# Patient Record
Sex: Female | Born: 1995 | Race: Black or African American | Hispanic: No | Marital: Single | Smoking: Never smoker
Health system: Southern US, Community
[De-identification: ages and names within clinical notes are randomized; demographics above are authoritative.]

## PROBLEM LIST (undated history)

## (undated) DIAGNOSIS — G43909 Migraine, unspecified, not intractable, without status migrainosus: Secondary | ICD-10-CM

## (undated) HISTORY — PX: TONSILLECTOMY: SUR1361

## (undated) HISTORY — PX: TONSILLECTOMY AND ADENOIDECTOMY: SUR1326

---

## 2016-05-18 ENCOUNTER — Encounter (HOSPITAL_COMMUNITY): Payer: Self-pay | Admitting: Emergency Medicine

## 2016-05-18 ENCOUNTER — Ambulatory Visit (HOSPITAL_COMMUNITY)
Admission: EM | Admit: 2016-05-18 | Discharge: 2016-05-18 | Disposition: A | Discharge status: Home / Self Care | Payer: Self-pay | Attending: Family Medicine | Admitting: Family Medicine

## 2016-05-18 DIAGNOSIS — T148 Other injury of unspecified body region: Secondary | ICD-10-CM

## 2016-05-18 DIAGNOSIS — M79671 Pain in right foot: Secondary | ICD-10-CM

## 2016-05-18 DIAGNOSIS — W57XXXA Bitten or stung by nonvenomous insect and other nonvenomous arthropods, initial encounter: Secondary | ICD-10-CM

## 2016-05-18 MED ORDER — PREDNISONE 50 MG PO TABS: ORAL_TABLET | ORAL | 0 refills | Status: DC

## 2016-05-18 MED ORDER — TRIAMCINOLONE ACETONIDE 0.1 % EX CREA: 1.0000 "application " | TOPICAL_CREAM | Freq: Two times a day (BID) | CUTANEOUS | 0 refills | Status: DC

## 2016-05-18 NOTE — ED Triage Notes (Signed)
Patient is [place] back [place] [facility].  Yesterday noticed pain, swelling, itching, burning to right foot.  Patient has not seen a bug or felt a sting.  No known injury

## 2016-05-18 NOTE — ED Provider Notes (Signed)
CSN: [id number]     Arrival date & time 05/18/16  1202 History   First MD Initiated Contact with Patient 05/18/16 1222     Chief Complaint  Patient presents with  . Foot Pain   (Consider location/radiation/quality/duration/timing/severity/associated sxs/prior Treatment) 20 year old female states that yesterday she felt a bite or sting to the right foot. She noticed there was initially itching around the dorsum of the foot followed by a burning and stinging Pain. Over the next few hours there was swelling over the dorsum of the foot. She is complaining of tenderness, itching and burning to the dorsum of the foot. Swelling extends to but not including the ankle. The patient emphatically denies trauma, injury, repetitive use or other activity that would have produced some sort of injury. Denies systemic symptoms. No stomach swelling, itching or rash.      History reviewed. No pertinent past medical history. Past Surgical History:  Procedure Laterality Date  . TONSILLECTOMY     No family history on file. Social History  Substance Use Topics  . Smoking status: Never Smoker  . Smokeless tobacco: Never Used  . Alcohol use No   OB History    No data available     Review of Systems  Constitutional: Negative.  Negative for chills and fever.  HENT: Negative.   Eyes: Negative.   Respiratory: Negative.  Negative for cough and shortness of breath.   Cardiovascular: Negative for chest pain and leg swelling.  Gastrointestinal: Negative.   Skin: Negative for color change, rash and wound.  Neurological: Negative.   All other systems reviewed and are negative.   Allergies  Review of patient's allergies indicates no known allergies.  Home Medications   Prior to Admission medications   Medication Sig Start Date End Date Taking? Authorizing Provider  predniSONE (DELTASONE) 50 MG tablet 1 tab po on day 1, one tab po on day 2, 1/2 tab po on days 3 and 4 05/18/16   Hayden Rasmussen, NP   triamcinolone cream (KENALOG) 0.1 % Apply 1 application topically 2 (two) times daily. 05/18/16   Hayden Rasmussen, NP   Meds Ordered and Administered this Visit  Medications - No data to display  BP 121/57 (BP Location: Left Arm)   Pulse 80   Temp 99 F (37.2 C) (Oral)   Resp 12   SpO2 98%  No data found.   Physical Exam  Constitutional: She is oriented to person, place, and time. She appears well-developed and well-nourished. No distress.  HENT:  Head: Normocephalic and atraumatic.  Eyes: EOM are normal.  Neck: Normal range of motion. Neck supple.  Cardiovascular: Normal rate.   Pulmonary/Chest: Effort normal. No respiratory distress.  Musculoskeletal: She exhibits edema and tenderness.  Right foot with diffuse swelling over the dorsum of the foot. There is no erythema or other discoloration. No deformity. There is tenderness and sensitivity to the overlying skin with a very light touch. No signs of cellulitis or lymphangitis. No wound. No apparent bite mark. Demonstrates full range of motion of the ankle and inability to wiggle her toes.  Neurological: She is alert and oriented to person, place, and time. She exhibits normal muscle tone.  Skin: Skin is warm and dry.  Psychiatric: She has a normal mood and affect.  Nursing note and vitals reviewed.   Urgent Care Course   Clinical Course    Procedures (including critical care time)  Labs Review Labs Reviewed - No data to display  Imaging Review No results found.  Visual Acuity Review  Right Eye Distance:   Left Eye Distance:   Bilateral Distance:    Right Eye Near:   Left Eye Near:    Bilateral Near:         MDM   1. Insect bite   2. Foot pain, right    Insect Bite Read these instructions. Apply cold compresses or ice over the foot 4-5 times a day for the next 2 days. Apply the triamcinolone cream over the top of the foot twice a day. Recommend taking an antihistamine such as Claritin or Allegra on a  daily basis for the next few days to help with itching and swelling. For worsening, new symptoms or problems, red streaks, swelling in your face or time or throat, cough or trouble breathing or other problems seek medical attention promptly. Meds ordered this encounter  Medications  . predniSONE (DELTASONE) 50 MG tablet    Sig: 1 tab po on day 1, one tab po on day 2, 1/2 tab po on days 3 and 4    Dispense:  3 tablet    Refill:  0    Order Specific Question:   Supervising Provider    Answer:   Tyrone NineGRUNZ, RYAN B [id number]]  . triamcinolone cream (KENALOG) 0.1 %    Sig: Apply 1 application topically 2 (two) times daily.    Dispense:  15 g    Refill:  0    Order Specific Question:   Supervising Provider    Answer:   Candie MileGRUNZ, RYAN B [[id number]]       Hayden Rasmussenavid Levoy Geisen, NP 05/18/16 1256

## 2019-01-13 DIAGNOSIS — J4520 Mild intermittent asthma, uncomplicated: Secondary | ICD-10-CM | POA: Insufficient documentation

## 2019-06-05 ENCOUNTER — Ambulatory Visit (HOSPITAL_COMMUNITY)
Admission: EM | Admit: 2019-06-05 | Discharge: 2019-06-05 | Disposition: A | Discharge status: Home / Self Care | Payer: BC Managed Care – PPO | Attending: Emergency Medicine | Admitting: Emergency Medicine

## 2019-06-05 ENCOUNTER — Encounter (HOSPITAL_COMMUNITY): Payer: Self-pay | Admitting: Emergency Medicine

## 2019-06-05 ENCOUNTER — Other Ambulatory Visit: Payer: Self-pay

## 2019-06-05 DIAGNOSIS — R51 Headache: Secondary | ICD-10-CM | POA: Diagnosis not present

## 2019-06-05 DIAGNOSIS — R519 Headache, unspecified: Secondary | ICD-10-CM

## 2019-06-05 MED ORDER — ONDANSETRON 4 MG PO TBDP
4.0000 mg | ORAL_TABLET | Freq: Once | ORAL | Status: AC
  Administered 2019-06-05: 4 mg via ORAL

## 2019-06-05 MED ORDER — ONDANSETRON HCL 4 MG PO TABS: 4.0000 mg | ORAL_TABLET | Freq: Four times a day (QID) | ORAL | 0 refills | Status: DC

## 2019-06-05 MED ORDER — KETOROLAC TROMETHAMINE 30 MG/ML IJ SOLN
30.0000 mg | Freq: Once | INTRAMUSCULAR | Status: AC
  Administered 2019-06-05: 30 mg via INTRAMUSCULAR

## 2019-06-05 MED ORDER — ONDANSETRON 4 MG PO TBDP
ORAL_TABLET | ORAL | Status: AC
  Filled 2019-06-05: qty 1

## 2019-06-05 MED ORDER — IBUPROFEN 600 MG PO TABS: 600.0000 mg | ORAL_TABLET | Freq: Four times a day (QID) | ORAL | 0 refills | Status: DC | PRN

## 2019-06-05 MED ORDER — KETOROLAC TROMETHAMINE 30 MG/ML IJ SOLN
INTRAMUSCULAR | Status: AC
  Filled 2019-06-05: qty 1

## 2019-06-05 NOTE — ED Provider Notes (Signed)
[facility]    CSN: [id number] Arrival date & time: 06/05/19  1705      History   Chief Complaint Chief Complaint  Patient presents with  . Headache    HPI Beverly Perkins is a 23 y.o. female.   Patient presents with generalized headache x1 week; and nausea which started today. No emesis.  She has taken over-the-counter pain relievers such as BC powders which relieve her headache but then it "returns when the medication wears off".  No known falls or injury.  Patient states she has had a similar headache in the past when she first started her depo injections but her depo injection is not due again for one month.  She denies  dizziness, numbness, weakness, vision changes, fever, congestion, ear pain, sore throat, or other symptoms.    The history is provided by the patient.    History reviewed. No pertinent past medical history.  There are no active problems to display for this patient.   Past Surgical History:  Procedure Laterality Date  . TONSILLECTOMY      OB History   No obstetric history on file.      Home Medications    Prior to Admission medications   Medication Sig Start Date End Date Taking? Authorizing Provider  ibuprofen (ADVIL) 600 MG tablet Take 1 tablet (600 mg total) by mouth every 6 (six) hours as needed. 06/06/19   Mickie Bailate, Julisa Flippo H, NP  ondansetron (ZOFRAN) 4 MG tablet Take 1 tablet (4 mg total) by mouth every 6 (six) hours. 06/06/19   Mickie Bailate, Winda Summerall H, NP  predniSONE (DELTASONE) 50 MG tablet 1 tab po on day 1, one tab po on day 2, 1/2 tab po on days 3 and 4 Patient not taking: Reported on 06/05/2019 05/18/16   Hayden RasmussenMabe, David, NP  triamcinolone cream (KENALOG) 0.1 % Apply 1 application topically 2 (two) times daily. 05/18/16   Hayden RasmussenMabe, David, NP    Family History Family History  Problem Relation Age of Onset  . Hypertension Mother     Social History Social History   Tobacco Use  . Smoking status: Never Smoker  . Smokeless tobacco: Never Used   Substance Use Topics  . Alcohol use: No  . Drug use: No     Allergies   Patient has no known allergies.   Review of Systems Review of Systems  Constitutional: Negative for chills and fever.  HENT: Negative for congestion, ear pain, rhinorrhea and sore throat.   Eyes: Negative for pain and visual disturbance.  Respiratory: Negative for cough and shortness of breath.   Cardiovascular: Negative for chest pain and palpitations.  Gastrointestinal: Positive for nausea. Negative for abdominal pain, diarrhea and vomiting.  Genitourinary: Negative for dysuria and hematuria.  Musculoskeletal: Negative for arthralgias and back pain.  Skin: Negative for color change and rash.  Neurological: Positive for headaches. Negative for seizures and syncope.  All other systems reviewed and are negative.    Physical Exam Triage Vital Signs ED Triage Vitals [06/05/19 1726]  Enc Vitals Group     BP 133/60     Pulse Rate 90     Resp 18     Temp 99.3 F (37.4 C)     Temp Source Oral     SpO2 97 %     Weight      Height      Head Circumference      Peak Flow      Pain Score 8  Pain Loc      Pain Edu?      Excl. in [place]?    No data found.  Updated Vital Signs BP 133/60 (BP Location: Right Arm)   Pulse 90   Temp 99.3 F (37.4 C) (Oral)   Resp 18   SpO2 97%   Visual Acuity Right Eye Distance:   Left Eye Distance:   Bilateral Distance:    Right Eye Near:   Left Eye Near:    Bilateral Near:     Physical Exam Vitals signs and nursing note reviewed.  Constitutional:      General: She is not in acute distress.    Appearance: She is well-developed.  HENT:     Head: Normocephalic and atraumatic.     Right Ear: Tympanic membrane normal.     Left Ear: Tympanic membrane normal.     Nose: Nose normal.     Mouth/Throat:     Mouth: Mucous membranes are moist.     Pharynx: Oropharynx is clear.  Eyes:     Extraocular Movements: Extraocular movements intact.      Conjunctiva/sclera: Conjunctivae normal.     Pupils: Pupils are equal, round, and reactive to light.  Neck:     Musculoskeletal: Neck supple.  Cardiovascular:     Rate and Rhythm: Normal rate and regular rhythm.     Heart sounds: No murmur.  Pulmonary:     Effort: Pulmonary effort is normal. No respiratory distress.     Breath sounds: Normal breath sounds.  Abdominal:     Palpations: Abdomen is soft.     Tenderness: There is no abdominal tenderness.  Skin:    General: Skin is warm and dry.  Neurological:     General: No focal deficit present.     Mental Status: She is alert and oriented to person, place, and time.     Cranial Nerves: No cranial nerve deficit.     Sensory: No sensory deficit.     Motor: No weakness.     Coordination: Coordination normal.     Gait: Gait normal.     Deep Tendon Reflexes: Reflexes normal.      UC Treatments / Results  Labs (all labs ordered are listed, but only abnormal results are displayed) Labs Reviewed - No data to display  EKG   Radiology No results found.  Procedures Procedures (including critical care time)  Medications Ordered in UC Medications  ketorolac (TORADOL) 30 MG/ML injection 30 mg (30 mg Intramuscular Given 06/05/19 1747)  ondansetron (ZOFRAN-ODT) disintegrating tablet 4 mg (4 mg Oral Given 06/05/19 1748)  ketorolac (TORADOL) 30 MG/ML injection (has no administration in time range)  ondansetron (ZOFRAN-ODT) 4 MG disintegrating tablet (has no administration in time range)    Initial Impression / Assessment and Plan / UC Course  I have reviewed the triage vital signs and the nursing notes.  Pertinent labs & imaging results that were available during my care of the patient were reviewed by me and considered in my medical decision making (see chart for details).   Headache and nausea.  Treating with Toradol and Zofran here.  Prescription for ibuprofen and Zofran PRN to start tomorrow.  Instructed patient to call her PCP  to be seen if her headache persists.  Instructed her to go to the emergency department if she develops acute worsening pain, changes in her vision, dizziness, numbness, weakness, or other concerning symptoms.  Patient agrees with plan of care.     Final Clinical Impressions(s) /  UC Diagnoses   Final diagnoses:  Acute nonintractable headache, unspecified headache type     Discharge Instructions     You were given an injection of a pain medication called Toradol today.  You were given a medication for your nausea called Zofran.    Additionally, you can take the prescribed ibuprofen and Zofran as directed starting tomorrow.    Follow-up with your primary care provider or the provider listed below if your headache continues.  Go to the emergency department if you develop acute worsening pain, changes in your vision, dizziness, numbness, weakness, or other concerning symptoms.        ED Prescriptions    Medication Sig Dispense Auth. Provider   ibuprofen (ADVIL) 600 MG tablet Take 1 tablet (600 mg total) by mouth every 6 (six) hours as needed. 30 tablet Mickie Bail, NP   ondansetron (ZOFRAN) 4 MG tablet Take 1 tablet (4 mg total) by mouth every 6 (six) hours. 12 tablet Mickie Bail, NP     Controlled Substance Prescriptions [place] Controlled Substance Registry consulted? Not Applicable   Mickie Bail, NP 06/05/19 1751

## 2019-06-05 NOTE — ED Triage Notes (Signed)
Pt sts HA x 5 days with some nausea today

## 2019-06-05 NOTE — Discharge Instructions (Signed)
You were given an injection of a pain medication called Toradol today.  You were given a medication for your nausea called Zofran.    Additionally, you can take the prescribed ibuprofen and Zofran as directed starting tomorrow.    Follow-up with your primary care provider or the provider listed below if your headache continues.  Go to the emergency department if you develop acute worsening pain, changes in your vision, dizziness, numbness, weakness, or other concerning symptoms.

## 2019-07-06 DIAGNOSIS — R519 Headache, unspecified: Secondary | ICD-10-CM | POA: Insufficient documentation

## 2020-01-11 ENCOUNTER — Ambulatory Visit: Payer: BC Managed Care – PPO | Attending: Internal Medicine

## 2020-01-11 DIAGNOSIS — Z20822 Contact with and (suspected) exposure to covid-19: Secondary | ICD-10-CM

## 2020-01-12 LAB — SARS-COV-2, NAA 2 DAY TAT

## 2020-01-12 LAB — NOVEL CORONAVIRUS, NAA: SARS-CoV-2, NAA: NOT DETECTED

## 2020-05-28 DIAGNOSIS — F419 Anxiety disorder, unspecified: Secondary | ICD-10-CM | POA: Insufficient documentation

## 2021-01-03 ENCOUNTER — Encounter (HOSPITAL_COMMUNITY): Payer: Self-pay | Admitting: Emergency Medicine

## 2021-01-03 ENCOUNTER — Encounter: Payer: Self-pay | Admitting: Nurse Practitioner

## 2021-01-03 ENCOUNTER — Emergency Department (HOSPITAL_COMMUNITY)
Admission: EM | Admit: 2021-01-03 | Discharge: 2021-01-03 | Disposition: A | Discharge status: Home / Self Care | Payer: 59 | Attending: Emergency Medicine | Admitting: Emergency Medicine

## 2021-01-03 ENCOUNTER — Emergency Department (HOSPITAL_COMMUNITY): Payer: 59

## 2021-01-03 DIAGNOSIS — R197 Diarrhea, unspecified: Secondary | ICD-10-CM | POA: Insufficient documentation

## 2021-01-03 DIAGNOSIS — F17200 Nicotine dependence, unspecified, uncomplicated: Secondary | ICD-10-CM | POA: Diagnosis not present

## 2021-01-03 DIAGNOSIS — G8929 Other chronic pain: Secondary | ICD-10-CM | POA: Insufficient documentation

## 2021-01-03 DIAGNOSIS — R1011 Right upper quadrant pain: Secondary | ICD-10-CM | POA: Insufficient documentation

## 2021-01-03 DIAGNOSIS — R112 Nausea with vomiting, unspecified: Secondary | ICD-10-CM | POA: Insufficient documentation

## 2021-01-03 HISTORY — DX: Migraine, unspecified, not intractable, without status migrainosus: G43.909

## 2021-01-03 LAB — CBC
HCT: 44.9 % (ref 36.0–46.0)
Hemoglobin: 14.3 g/dL (ref 12.0–15.0)
MCH: 27.7 pg (ref 26.0–34.0)
MCHC: 31.8 g/dL (ref 30.0–36.0)
MCV: 86.8 fL (ref 80.0–100.0)
Platelets: 485 10*3/uL — ABNORMAL HIGH (ref 150–400)
RBC: 5.17 MIL/uL — ABNORMAL HIGH (ref 3.87–5.11)
RDW: 13.3 % (ref 11.5–15.5)
WBC: 24.3 10*3/uL — ABNORMAL HIGH (ref 4.0–10.5)
nRBC: 0 % (ref 0.0–0.2)

## 2021-01-03 LAB — COMPREHENSIVE METABOLIC PANEL
ALT: 19 U/L (ref 0–44)
AST: 17 U/L (ref 15–41)
Albumin: 4.1 g/dL (ref 3.5–5.0)
Alkaline Phosphatase: 75 U/L (ref 38–126)
Anion gap: 12 (ref 5–15)
BUN: 11 mg/dL (ref 6–20)
CO2: 18 mmol/L — ABNORMAL LOW (ref 22–32)
Calcium: 9.3 mg/dL (ref 8.9–10.3)
Chloride: 107 mmol/L (ref 98–111)
Creatinine, Ser: 0.66 mg/dL (ref 0.44–1.00)
GFR, Estimated: >60 mL/min (ref >60)
Glucose, Bld: 135 mg/dL — ABNORMAL HIGH (ref 70–99)
Potassium: 3.6 mmol/L (ref 3.5–5.1)
Sodium: 137 mmol/L (ref 135–145)
Total Bilirubin: 0.6 mg/dL (ref 0.3–1.2)
Total Protein: 8.4 g/dL — ABNORMAL HIGH (ref 6.5–8.1)

## 2021-01-03 LAB — URINALYSIS, ROUTINE W REFLEX MICROSCOPIC
Bilirubin Urine: NEGATIVE
Glucose, UA: NEGATIVE mg/dL
Hgb urine dipstick: NEGATIVE
Ketones, ur: 20 mg/dL — AB
Leukocytes,Ua: NEGATIVE
Nitrite: NEGATIVE
Protein, ur: NEGATIVE mg/dL
Specific Gravity, Urine: >1.046 — ABNORMAL HIGH (ref 1.005–1.030)
pH: 5 (ref 5.0–8.0)

## 2021-01-03 LAB — LIPASE, BLOOD: Lipase: 32 U/L (ref 11–51)

## 2021-01-03 LAB — I-STAT BETA HCG BLOOD, ED (MC, WL, AP ONLY): I-stat hCG, quantitative: <5 m[IU]/mL (ref <5)

## 2021-01-03 MED ORDER — SODIUM CHLORIDE 0.9 % IV BOLUS
500.0000 mL | Freq: Once | INTRAVENOUS | Status: AC
  Administered 2021-01-03: 500 mL via INTRAVENOUS

## 2021-01-03 MED ORDER — ONDANSETRON HCL 4 MG/2ML IJ SOLN
4.0000 mg | Freq: Once | INTRAMUSCULAR | Status: AC
  Administered 2021-01-03: 4 mg via INTRAVENOUS
  Filled 2021-01-03: qty 2

## 2021-01-03 MED ORDER — IOHEXOL 300 MG/ML  SOLN
100.0000 mL | Freq: Once | INTRAMUSCULAR | Status: AC | PRN
  Administered 2021-01-03: 100 mL via INTRAVENOUS

## 2021-01-03 MED ORDER — KETOROLAC TROMETHAMINE 30 MG/ML IJ SOLN
30.0000 mg | Freq: Once | INTRAMUSCULAR | Status: AC
  Administered 2021-01-03: 30 mg via INTRAVENOUS
  Filled 2021-01-03: qty 1

## 2021-01-03 NOTE — ED Triage Notes (Signed)
Pt reports upper abdominal pain ongoing for the last several months, with vomiting and diarrhea. Current episode began last Thursday. Denies recent fevers. VSS.

## 2021-01-03 NOTE — Discharge Instructions (Signed)
You have been seen and discharged from the emergency department.  Establish care and follow-up with gastroenterology for reevaluation and further care.  You may require scoping for further diagnosis.  Take home medications as prescribed. If you have any worsening symptoms or further concerns for health please return to an emergency department for further evaluation.

## 2021-01-03 NOTE — ED Provider Notes (Signed)
[facility] EMERGENCY DEPARTMENT Provider Note   CSN: [id number] Arrival date & time: 01/03/21  7829     History Chief Complaint  Patient presents with  . Abdominal Pain    Beverly Perkins is a 25 y.o. female.  HPI   25 year old female with past medical history of migraines presents to the emergency department with concern for acute on chronic abdominal pain.  Patient states of the past couple months she has been having upper abdominal pain that is sometimes associated with nausea/vomiting/diarrhea.  About a week ago the pain became more severe, persistent.  For the last week she has been having nausea/vomiting/diarrhea, nonbloody.  But now she feels as if the abdominal pain is at its worse.  No fever.  No genitourinary symptoms.  No previous abdominal surgeries.  States that she gets regular periods and does not believe that she could be pregnant.  Past Medical History:  Diagnosis Date  . Migraines     There are no problems to display for this patient.   Past Surgical History:  Procedure Laterality Date  . TONSILLECTOMY       OB History   No obstetric history on file.     Family History  Problem Relation Age of Onset  . Hypertension Mother     Social History   Tobacco Use  . Smoking status: Current Every Day Smoker  . Smokeless tobacco: Never Used  Substance Use Topics  . Alcohol use: Yes    Comment: occasional   . Drug use: Yes    Types: Marijuana    Home Medications Prior to Admission medications   Medication Sig Start Date End Date Taking? Authorizing Provider  ibuprofen (ADVIL) 600 MG tablet Take 1 tablet (600 mg total) by mouth every 6 (six) hours as needed. 06/06/19   Mickie Bail, NP  ondansetron (ZOFRAN) 4 MG tablet Take 1 tablet (4 mg total) by mouth every 6 (six) hours. 06/06/19   Mickie Bail, NP  predniSONE (DELTASONE) 50 MG tablet 1 tab po on day 1, one tab po on day 2, 1/2 tab po on days 3 and 4 Patient not taking:  Reported on 06/05/2019 05/18/16   Hayden Rasmussen, NP  triamcinolone cream (KENALOG) 0.1 % Apply 1 application topically 2 (two) times daily. 05/18/16   Hayden Rasmussen, NP    Allergies    Patient has no known allergies.  Review of Systems   Review of Systems  Constitutional: Negative for chills and fever.  HENT: Negative for congestion.   Eyes: Negative for visual disturbance.  Respiratory: Negative for shortness of breath.   Cardiovascular: Negative for chest pain.  Gastrointestinal: Positive for abdominal pain, diarrhea, nausea and vomiting. Negative for blood in stool.  Genitourinary: Negative for dysuria, pelvic pain, vaginal bleeding and vaginal discharge.  Skin: Negative for rash.  Neurological: Negative for headaches.    Physical Exam Updated Vital Signs BP 100/77   Pulse 83   Temp (!) 97.5 F (36.4 C) (Oral)   Resp (!) 22   Ht 5\' 2"  (1.575 m)   SpO2 99%   Physical Exam Vitals and nursing note reviewed.  Constitutional:      Appearance: Normal appearance.  HENT:     Head: Normocephalic.     Mouth/Throat:     Mouth: Mucous membranes are moist.  Cardiovascular:     Rate and Rhythm: Normal rate.  Pulmonary:     Effort: Pulmonary effort is normal. No respiratory distress.  Abdominal:  General: There is no distension.     Palpations: Abdomen is soft.     Tenderness: There is generalized abdominal tenderness and tenderness in the right upper quadrant and epigastric area. Negative signs include Murphy's sign.     Hernia: No hernia is present.  Skin:    General: Skin is warm.  Neurological:     Mental Status: She is alert and oriented to person, place, and time. Mental status is at baseline.  Psychiatric:        Mood and Affect: Mood normal.     ED Results / Procedures / Treatments   Labs (all labs ordered are listed, but only abnormal results are displayed) Labs Reviewed  COMPREHENSIVE METABOLIC PANEL - Abnormal; Notable for the following components:      Result  Value   CO2 18 (*)    Glucose, Bld 135 (*)    Total Protein 8.4 (*)    All other components within normal limits  CBC - Abnormal; Notable for the following components:   WBC 24.3 (*)    RBC 5.17 (*)    Platelets 485 (*)    All other components within normal limits  LIPASE, BLOOD  URINALYSIS, ROUTINE W REFLEX MICROSCOPIC  I-STAT BETA HCG BLOOD, ED ([facility], [facility], [facility] ONLY)    EKG None  Radiology No results found.  Procedures Procedures   Medications Ordered in ED Medications  sodium chloride 0.9 % bolus 500 mL (500 mLs Intravenous New Bag/Given 01/03/21 1056)  ketorolac (TORADOL) 30 MG/ML injection 30 mg (30 mg Intravenous Given 01/03/21 1056)  ondansetron (ZOFRAN) injection 4 mg (4 mg Intravenous Given 01/03/21 1055)    ED Course  I have reviewed the triage vital signs and the nursing notes.  Pertinent labs & imaging results that were available during my care of the patient were reviewed by me and considered in my medical decision making (see chart for details).    MDM Rules/Calculators/A&P                          25 year old female presents the emergency department generalized abdominal pain, nausea/vomiting/diarrhea.  She has diffuse tenderness on palpation, no guarding.  Blood work shows a leukocytosis of 24.  Patient has steroids listed as an active medication however she states this was a short course over a month ago for COVID treatment.  She denies any active medications.  Ultrasound of the gallbladder shows no abnormal findings.  Given the leukocytosis and tender abdomen is CT was done, this showed no acute finding.  Patient was treated symptomatically, feels improved, is asking to eat and drink.  She did not give a urine sample while in the department, has no genitourinary symptoms of I have low suspicion for UTI.  On reevaluation abdomen is soft and reassuring.  Patient reveals that she has had the symptoms waxing and waning for a long time, I recommend that she follows up  with gastroenterology for further evaluation.  Patient will be discharged and treated as an outpatient.  Discharge plan and strict return to ED precautions discussed, patient verbalizes understanding and agreement.  Final Clinical Impression(s) / ED Diagnoses Final diagnoses:  RUQ abdominal pain    Rx / DC Orders ED Discharge Orders    None       Rozelle Logan, DO 01/03/21 1620

## 2021-02-01 ENCOUNTER — Encounter: Payer: Self-pay | Admitting: Nurse Practitioner

## 2021-02-01 ENCOUNTER — Other Ambulatory Visit (INDEPENDENT_AMBULATORY_CARE_PROVIDER_SITE_OTHER): Payer: 59

## 2021-02-01 ENCOUNTER — Ambulatory Visit (INDEPENDENT_AMBULATORY_CARE_PROVIDER_SITE_OTHER): Payer: 59 | Admitting: Nurse Practitioner

## 2021-02-01 VITALS — BP 118/78 | HR 76 | Ht 62.0 in | Wt 215.1 lb

## 2021-02-01 DIAGNOSIS — K5900 Constipation, unspecified: Secondary | ICD-10-CM

## 2021-02-01 DIAGNOSIS — R1011 Right upper quadrant pain: Secondary | ICD-10-CM | POA: Diagnosis not present

## 2021-02-01 DIAGNOSIS — D72829 Elevated white blood cell count, unspecified: Secondary | ICD-10-CM | POA: Diagnosis not present

## 2021-02-01 DIAGNOSIS — R112 Nausea with vomiting, unspecified: Secondary | ICD-10-CM | POA: Diagnosis not present

## 2021-02-01 LAB — TSH: TSH: 0.83 u[IU]/mL (ref 0.35–4.50)

## 2021-02-01 MED ORDER — HYOSCYAMINE SULFATE 0.125 MG SL SUBL: 0.1250 mg | SUBLINGUAL_TABLET | SUBLINGUAL | 0 refills | Status: AC | PRN

## 2021-02-01 MED ORDER — FAMOTIDINE 20 MG PO TABS: 20.0000 mg | ORAL_TABLET | Freq: Every day | ORAL | 1 refills | Status: AC

## 2021-02-01 NOTE — Progress Notes (Signed)
Agree with assessment and plan as outlined.  

## 2021-02-01 NOTE — Addendum Note (Signed)
Addended by: Darliss Ridgel I on: 02/01/2021 01:18 PM   Modules accepted: Orders

## 2021-02-01 NOTE — Patient Instructions (Addendum)
If you are age 26 or younger, your body mass index should be between 19-25. Your Body mass index is 39.35 kg/m. If this is out of the aformentioned range listed, please consider follow up with your Primary Care Provider.   LABS:  Lab work has been ordered for you today. Our lab is located in the basement. Press "B" on the elevator. The lab is located at the first door on the left as you exit the elevator.  HEALTHCARE LAWS AND MY CHART RESULTS: Due to recent changes in healthcare laws, you may see the results of your imaging and laboratory studies on MyChart before your provider has had a chance to review them.   We understand that in some cases there may be results that are confusing or concerning to you. Not all laboratory results come back in the same time frame and the provider may be waiting for multiple results in order to interpret others.  Please give [place] 48 hours in order for your provider to thoroughly review all the results before contacting the office for clarification of your results.   MEDICATION: We have sent the following medication to your pharmacy for you to pick up at your convenience: Famotidine 20 MG once a day. Hyoscyamine (LEVSIN SL) 0.125 MG SL tablet: Place 1 tablet (0.125 mg total) under the tongue every 6 (six) hours as needed.  OVER THE COUNTER MEDICATION Please purchase the following medications over the counter and take as directed:  Miralax- Dissolve one capful in 8 ounces of water and drink before bed.  RECOMMENDATIONS: Please review the GERD information provided today. Call us of your symptoms worsen.  It was great seeing you today! Thank you for entrusting me with your care and choosing [facility].  Arnaldo Natal, CRNP   Gastroesophageal Reflux Disease, Adult  Gastroesophageal reflux (GER) happens when acid from the stomach flows up into the tube that connects the mouth and the stomach (esophagus). Normally, food travels down the  esophagus and stays in the stomach to be digested. With GER, food and stomach acid sometimes move back up into the esophagus. You may have a disease called gastroesophageal reflux disease (GERD) if the reflux:  Happens often.  Causes frequent or very bad symptoms.  Causes problems such as damage to the esophagus. When this happens, the esophagus becomes sore and swollen. Over time, GERD can make small holes (ulcers) in the lining of the esophagus. What are the causes? This condition is caused by a problem with the muscle between the esophagus and the stomach. When this muscle is weak or not normal, it does not close properly to keep food and acid from coming back up from the stomach. The muscle can be weak because of:  Tobacco use.  Pregnancy.  Having a certain type of hernia (hiatal hernia).  Alcohol use.  Certain foods and drinks, such as coffee, chocolate, onions, and peppermint. What increases the risk?  Being overweight.  Having a disease that affects your connective tissue.  Taking NSAIDs, such a ibuprofen. What are the signs or symptoms?  Heartburn.  Difficult or painful swallowing.  The feeling of having a lump in the throat.  A bitter taste in the mouth.  Bad breath.  Having a lot of saliva.  Having an upset or bloated stomach.  Burping.  Chest pain. Different conditions can cause chest pain. Make sure you see your doctor if you have chest pain.  Shortness of breath or wheezing.  A long-term cough or  a cough at night.  Wearing away of the surface of teeth (tooth enamel).  Weight loss. How is this treated?  Making changes to your diet.  Taking medicine.  Having surgery. Treatment will depend on how bad your symptoms are. Follow these instructions at home: Eating and drinking  Follow a diet as told by your doctor. You may need to avoid foods and drinks such as: ? Coffee and tea, with or without caffeine. ? Drinks that contain  alcohol. ? Energy drinks and sports drinks. ? Bubbly (carbonated) drinks or sodas. ? Chocolate and cocoa. ? Peppermint and mint flavorings. ? Garlic and onions. ? Horseradish. ? Spicy and acidic foods. These include peppers, chili powder, curry powder, vinegar, hot sauces, and BBQ sauce. ? Citrus fruit juices and citrus fruits, such as oranges, lemons, and limes. ? Tomato-based foods. These include red sauce, chili, salsa, and pizza with red sauce. ? Fried and fatty foods. These include donuts, french fries, potato chips, and high-fat dressings. ? High-fat meats. These include hot dogs, rib eye steak, sausage, ham, and bacon. ? High-fat dairy items, such as whole milk, butter, and cream cheese.  Eat small meals often. Avoid eating large meals.  Avoid drinking large amounts of liquid with your meals.  Avoid eating meals during the 2-3 hours before bedtime.  Avoid lying down right after you eat.  Do not exercise right after you eat.   Lifestyle  Do not smoke or use any products that contain nicotine or tobacco. If you need help quitting, ask your doctor.  Try to lower your stress. If you need help doing this, ask your doctor.  If you are overweight, lose an amount of weight that is healthy for you. Ask your doctor about a safe weight loss goal.   General instructions  Pay attention to any changes in your symptoms.  Take over-the-counter and prescription medicines only as told by your doctor.  Do not take aspirin, ibuprofen, or other NSAIDs unless your doctor says it is okay.  Wear loose clothes. Do not wear anything tight around your waist.  Raise (elevate) the head of your bed about 6 inches (15 cm). You may need to use a wedge to do this.  Avoid bending over if this makes your symptoms worse.  Keep all follow-up visits. Contact a doctor if:  You have new symptoms.  You lose weight and you do not know why.  You have trouble swallowing or it hurts to swallow.  You  have wheezing or a cough that keeps happening.  You have a hoarse voice.  Your symptoms do not get better with treatment. Get help right away if:  You have sudden pain in your arms, neck, jaw, teeth, or back.  You suddenly feel sweaty, dizzy, or light-headed.  You have chest pain or shortness of breath.  You vomit and the vomit is green, yellow, or black, or it looks like blood or coffee grounds.  You faint.  Your poop (stool) is red, bloody, or black.  You cannot swallow, drink, or eat. These symptoms may represent a serious problem that is an emergency. Do not wait to see if the symptoms will go away. Get medical help right away. Call your local emergency services (911 in the [place] Do not drive yourself to the hospital. Summary  If a person has gastroesophageal reflux disease (GERD), food and stomach acid move back up into the esophagus and cause symptoms or problems such as damage to the esophagus.  Treatment will  depend on how bad your symptoms are.  Follow a diet as told by your doctor.  Take all medicines only as told by your doctor. This information is not intended to replace advice given to you by your health care provider. Make sure you discuss any questions you have with your health care provider. Document Revised: 04/02/2020 Document Reviewed: 04/02/2020 Elsevier Patient Education  2021 [employer].

## 2021-02-01 NOTE — Progress Notes (Signed)
02/01/2021 Beverly Perkins [id number] 1996/05/11   CHIEF COMPLAINT: RUQ abdominal pain   HISTORY OF PRESENT ILLNESS:  Beverly Perkins is a 25 year old female with a past medical history of migraine headaches an COVID 19 infection Dec. 2021. Past tonsillectomy and adenoidectomy.  She presents to our office today for further evaluation regarding nausea, vomiting and right upper quadrant abdominal pain.  She reports having intermittent stomach pain since mid childhood.  No known food allergies.  She possibly saw a gastroenterologist during college who ordered stool test which she did not complete.  She denies ever having any endoscopic evaluation.  She describes having episodes of nausea, vomiting or dry heaves with RUQ pain which started 2 months ago and occur once weekly. Emesis is yellow fluid, no hematemesis or coffee ground emesis.  No dysphagia or heartburn.  She sometimes has diarrhea during these episodes.  Her episodes  Of N/V and RUQ pain are often triggered after eating spicy or greasy foods and are unrelated to the time she has a migraine headache.  No rectal bleeding or melena.  She previously passed 3-4 normal bowel movements daily and recently is passing smaller brown formed stools twice daily.  Her most recent attack of N/V and RUQ pain occurred on 01/03/2021. At that time, she stated her RUQ was a 10 out of 10 so she presented to [facility] ED for further evaluation.  Laboratory studies in the ED showed a leukocytosis with WBC count 24.3.  LFTs and lipase levels were normal.  Right upper quadrant ultrasound showed a normal gallbladder, no gallstones.  Abdominal/pelvic CT scan without evidence of acute intra-abdominal/pelvic pathology.  She received IV fluid, Zofran and a Toradol injection and her abdominal pain significantly improved.  She was discharged home with instructions to schedule GI evaluation.  Currently, she feels well.  She is avoiding fatty and fried food  intake in her diet since her ED evaluation without recurrence of N/V or upper abdominal pain. No diarrhea.  No known family history of IBD.  No other complaints at this time.    CBC Latest Ref Rng & Units 01/03/2021  WBC 4.0 - 10.5 K/uL 24.3(H)  Hemoglobin 12.0 - 15.0 g/dL 37.1  Hematocrit 69.6 - 46.0 % 44.9  Platelets 150 - 400 K/uL 485(H)    CMP Latest Ref Rng & Units 01/03/2021  Glucose 70 - 99 mg/dL 789(F)  BUN 6 - 20 mg/dL 11  Creatinine 8.10 - 1.75 mg/dL 1.02  Sodium 585 - 277 mmol/L 137  Potassium 3.5 - 5.1 mmol/L 3.6  Chloride 98 - 111 mmol/L 107  CO2 22 - 32 mmol/L 18(L)  Calcium 8.9 - 10.3 mg/dL 9.3  Total Protein 6.5 - 8.1 g/dL 8.2(U)  Total Bilirubin 0.3 - 1.2 mg/dL 0.6  Alkaline Phos 38 - 126 U/L 75  AST 15 - 41 U/L 17  ALT 0 - 44 U/L 19  Lipase 32  Past Medical History:  Diagnosis Date  . Migraines    Past Surgical History:  Procedure Laterality Date  . TONSILLECTOMY AND ADENOIDECTOMY      Social History: She is single.  Non-smoker.  She drinks 1 alcoholic beverage daily or less.  No drug use.  Family History: Mother with hypertension.  Grandmother with diabetes and gout.  No family history of esophageal, gastric or colon cancer.  No Known Allergies    Outpatient Encounter Medications as of 02/01/2021  Medication Sig  . dicyclomine (BENTYL) 10 MG capsule  Take 10 mg by mouth 4 (four) times daily -  before meals and at bedtime.  . medroxyPROGESTERone (DEPO-PROVERA) 150 MG/ML injection Inject 150 mg into the muscle every 3 (three) months.   No facility-administered encounter medications on file as of 02/01/2021.    REVIEW OF SYSTEMS:  Gen: Denies fever, sweats or chills. No weight loss.  CV: Denies chest pain, palpitations or edema. Resp: Denies cough, shortness of breath of hemoptysis.  GI: Denies heartburn, dysphagia, stomach or lower abdominal pain. No diarrhea or constipation.  GU : Denies urinary burning, blood in urine, increased urinary frequency  or incontinence. MS: Denies joint pain, muscles aches or weakness. Derm: Denies rash, itchiness, skin lesions or unhealing ulcers. Psych: Denies depression, anxiety or memory loss. Heme: Denies bruising, bleeding. Neuro:  Denies headaches, dizziness or paresthesias. Endo:  Denies any problems with DM, thyroid or adrenal function.   PHYSICAL EXAM: BP 118/78 (BP Location: Left Arm, Patient Position: Sitting, Cuff Size: Large)   Pulse 76 Comment: irregular  Ht 5\' 2"  (1.575 m) Comment: height measured without shoes  Wt 215 lb 2 oz (97.6 kg)   BMI 39.35 kg/m    Denies chance of pregnancy on Depo Provera injections Q 3 months.  No menstrual cycle on Depo.  General: Well developed  25 year old female in no acute distress. Head: Normocephalic and atraumatic. Eyes:  Sclerae non-icteric, conjunctive pink. Ears: Normal auditory acuity. Mouth: Dentition intact. No ulcers or lesions.  Neck: Supple, no lymphadenopathy or thyromegaly.  Lungs: Clear bilaterally to auscultation without wheezes, crackles or rhonchi. Heart: Regular rate and rhythm. No murmur, rub or gallop appreciated.  Abdomen: Soft, nontender, non distended. No masses. No hepatosplenomegaly. Normoactive bowel sounds x 4 quadrants.  Rectal: Deferred. Musculoskeletal: Symmetrical with no gross deformities. Skin: Warm and dry. No rash or lesions on visible extremities. Extremities: No edema. Neurological: Alert oriented x 4, no focal deficits.  Psychological:  Alert and cooperative. Normal mood and affect.  ASSESSMENT AND PLAN:  36. 25 year old female with episodic N/V and RUQ pain (intermittently has diarrhea during episodes). Last episode occurred 01/03/2021. RUQ and CTAP 01/03/2021 were unrevealing. Leukocytosis with normal LFTs and Lipase level. No further N/V or RUQ pain since avoiding spicy and fatty foods follwing 01/03/2021 ED visit.  -EGD to rule out gastritis/duodenitis/PUD. EGD benefits and risks discussed including risk  with sedation, risk of bleeding, perforation and infection  -I discussed scheduling a [facility] HIDA scan if EGD negative or if symptoms recur -Famotidine 20 mg 1 tab p.o. daily -Hyoscyamine 0.125 mg 1 tab sublingual every 6 hours as needed for abdominal pain -I called the patient following her office visit to inform her that I have ordered a CBC to follow up on her leukocytosis, she will return to our lab  next week to have done.   2.  Constipation -MiraLAX nightly as needed -TSH, TTG, IGA  Further recommendations to be determined after the above evaluation completed        CC:  No ref. provider found

## 2021-02-02 LAB — IGA: Immunoglobulin A: 278 mg/dL (ref 47–310)

## 2021-02-04 LAB — TISSUE TRANSGLUTAMINASE ABS,IGG,IGA
(tTG) Ab, IgA: <1 U/mL
(tTG) Ab, IgG: <1 U/mL

## 2021-02-05 ENCOUNTER — Encounter: Payer: 59 | Admitting: Gastroenterology

## 2021-02-13 ENCOUNTER — Encounter: Payer: Self-pay | Admitting: Gastroenterology

## 2021-02-13 ENCOUNTER — Other Ambulatory Visit: Payer: Self-pay

## 2021-02-13 ENCOUNTER — Ambulatory Visit (AMBULATORY_SURGERY_CENTER): Payer: 59 | Admitting: Gastroenterology

## 2021-02-13 VITALS — BP 108/62 | HR 77 | Temp 97.3°F | Resp 16 | Ht 62.0 in | Wt 215.0 lb

## 2021-02-13 DIAGNOSIS — R112 Nausea with vomiting, unspecified: Secondary | ICD-10-CM | POA: Diagnosis not present

## 2021-02-13 DIAGNOSIS — R1010 Upper abdominal pain, unspecified: Secondary | ICD-10-CM

## 2021-02-13 MED ORDER — SODIUM CHLORIDE 0.9 % IV SOLN: 500.0000 mL | Freq: Once | INTRAVENOUS | Status: DC

## 2021-02-13 NOTE — Op Note (Signed)
[facility] Patient Name: Beverly Perkins Procedure Date: 02/13/2021 1:31 PM MRN: [id number] Endoscopist: Viviann Spare P. Adela Lank , MD Age: 25 Referring MD:  Date of Birth: Dec 23, 1995 Gender: Female Account #: [id number] Procedure:                Upper GI endoscopy Indications:              Upper abdominal pain, Nausea with vomiting -                            negative CT scan and RUQ [place], on pepcid, history of                            marijuana use Medicines:                Monitored Anesthesia Care Procedure:                Pre-Anesthesia Assessment:                           - Prior to the procedure, a History and Physical                            was performed, and patient medications and                            allergies were reviewed. The patient's tolerance of                            previous anesthesia was also reviewed. The risks                            and benefits of the procedure and the sedation                            options and risks were discussed with the patient.                            All questions were answered, and informed consent                            was obtained. Prior Anticoagulants: The patient has                            taken no previous anticoagulant or antiplatelet                            agents. ASA Grade Assessment: III - A patient with                            severe systemic disease. After reviewing the risks                            and benefits, the patient was deemed in  satisfactory condition to undergo the procedure.                           After obtaining informed consent, the endoscope was                            passed under direct vision. Throughout the                            procedure, the patient's blood pressure, pulse, and                            oxygen saturations were monitored continuously. The                            Endoscope was introduced through  the mouth, and                            advanced to the second part of duodenum. The upper                            GI endoscopy was accomplished without difficulty.                            The patient tolerated the procedure well. Scope In: Scope Out: Findings:                 Esophagogastric landmarks were identified: the                            Z-line was found at 35 cm, the gastroesophageal                            junction was found at 35 cm and the upper extent of                            the gastric folds was found at 35 cm from the                            incisors.                           The exam of the esophagus was otherwise normal.                           The entire examined stomach was normal. Biopsies                            were taken with a cold forceps for Helicobacter                            pylori testing.                           The duodenal bulb  and second portion of the                            duodenum were normal. Complications:            No immediate complications. Estimated blood loss:                            Minimal. Estimated Blood Loss:     Estimated blood loss was minimal. Impression:               - Esophagogastric landmarks identified.                           - Normal esophagus otherwise                           - Normal stomach. Biopsied to rule out H pylori.                           - Normal duodenal bulb and second portion of the                            duodenum.                           No overt cause for symptoms on EGD, will await                            biopsies to rule out H pylori. If patient uses                            marijuana frequently, that could be the etiology                            and recommend abstinence. If symptoms persist                            despite marijuana cessation consider HIDA scan. Recommendation:           - Patient has a contact number available for                             emergencies. The signs and symptoms of potential                            delayed complications were discussed with the                            patient. Return to normal activities tomorrow.                            Written discharge instructions were provided to the                            patient.                           -  Resume previous diet.                           - Continue present medications.                           - Await pathology results.                           - Marijuana cessation Willaim RayasSteven P. Daryl Beehler, MD 02/13/2021 1:47:09 PM This report has been signed electronically.

## 2021-02-13 NOTE — Progress Notes (Signed)
To pacu, VSS. Report to [email or web address] 

## 2021-02-13 NOTE — Progress Notes (Signed)
[facility]  Pt's states no medical or surgical changes since previsit or office visit.  

## 2021-02-13 NOTE — Patient Instructions (Signed)
Please read handouts provided. Continue present medications. Await pathology results. Marijuana cessation.    YOU HAD AN ENDOSCOPIC PROCEDURE TODAY AT THE [facility]:   Refer to the procedure report that was given to you for any specific questions about what was found during the examination.  If the procedure report does not answer your questions, please call your gastroenterologist to clarify.  If you requested that your care partner not be given the details of your procedure findings, then the procedure report has been included in a sealed envelope for you to review at your convenience later.  YOU SHOULD EXPECT: Some feelings of bloating in the abdomen. Passage of more gas than usual.  Walking can help get rid of the air that was put into your GI tract during the procedure and reduce the bloating. If you had a lower endoscopy (such as a colonoscopy or flexible sigmoidoscopy) you may notice spotting of blood in your stool or on the toilet paper. If you underwent a bowel prep for your procedure, you may not have a normal bowel movement for a few days.  Please Note:  You might notice some irritation and congestion in your nose or some drainage.  This is from the oxygen used during your procedure.  There is no need for concern and it should clear up in a day or so.  SYMPTOMS TO REPORT IMMEDIATELY:    Following upper endoscopy (EGD)  Vomiting of blood or coffee ground material  New chest pain or pain under the shoulder blades  Painful or persistently difficult swallowing  New shortness of breath  Fever of 100F or higher  Black, tarry-looking stools  For urgent or emergent issues, a gastroenterologist can be reached at any hour by calling [phone]. Do not use MyChart messaging for urgent concerns.    DIET:  We do recommend a small meal at first, but then you may proceed to your regular diet.  Drink plenty of fluids but you should avoid alcoholic beverages for 24  hours.  ACTIVITY:  You should plan to take it easy for the rest of today and you should NOT DRIVE or use heavy machinery until tomorrow (because of the sedation medicines used during the test).    FOLLOW UP: Our staff will call the number listed on your records 48-72 hours following your procedure to check on you and address any questions or concerns that you may have regarding the information given to you following your procedure. If we do not reach you, we will leave a message.  We will attempt to reach you two times.  During this call, we will ask if you have developed any symptoms of COVID 19. If you develop any symptoms (ie: fever, flu-like symptoms, shortness of breath, cough etc.) before then, please call [phone].  If you test positive for Covid 19 in the 2 weeks post procedure, please call and report this information to [place].    If any biopsies were taken you will be contacted by phone or by letter within the next 1-3 weeks.  Please call us at [phone] if you have not heard about the biopsies in 3 weeks.    SIGNATURES/CONFIDENTIALITY: You and/or your care partner have signed paperwork which will be entered into your electronic medical record.  These signatures attest to the fact that that the information above on your After Visit Summary has been reviewed and is understood.  Full responsibility of the confidentiality of this discharge information lies with you and/or  your care-partner. 

## 2021-02-13 NOTE — Progress Notes (Signed)
Called to room to assist during endoscopic procedure.  Patient ID and intended procedure confirmed with present staff. Received instructions for my participation in the procedure from the performing physician.  

## 2021-02-15 ENCOUNTER — Telehealth: Payer: Self-pay

## 2021-02-15 NOTE — Telephone Encounter (Signed)
  Follow up Call-  Call back number 02/13/2021  Post procedure Call Back phone  # [phone]  Permission to leave phone message Yes  Some recent data might be hidden     Patient questions:  Do you have a fever, pain , or abdominal swelling? No. Pain Score  0 *  Have you tolerated food without any problems? Yes.    Have you been able to return to your normal activities? Yes.    Do you have any questions about your discharge instructions: Diet   No. Medications  No. Follow up visit  No.  Do you have questions or concerns about your Care? No.  Actions: * If pain score is 4 or above: No action needed, pain <4.  1. Have you developed a fever since your procedure? No   2.   Have you had an respiratory symptoms (SOB or cough) since your procedure? No   3.   Have you tested positive for COVID 19 since your procedure no   4.   Have you had any family members/close contacts diagnosed with the COVID 19 since your procedure?  No    If yes to any of these questions please route to Laverna Peace, RN and Karlton Lemon, RN

## 2021-05-19 IMAGING — CT CT ABD-PELV W/ CM
2 of 4 series · 17 of 46 positions shown, 19 images · IV contrast (omnipaque)
Comparison: None.

CLINICAL DATA: Abdominal pain

EXAM:
CT ABDOMEN AND PELVIS WITH CONTRAST
TECHNIQUE: Multidetector CT imaging of the abdomen and pelvis was performed
using the standard protocol following bolus administration of
intravenous contrast.
CONTRAST:  100mL OMNIPAQUE IOHEXOL 300 MG/ML  SOLN

[Series 3: abd/ pelvis 5.0 i30f 2 · axial · 0.89mm/px · z∈[-413,+2]mm · 14 of 91 slices shown, 16 images]
[im 4/91  soft-tissue]
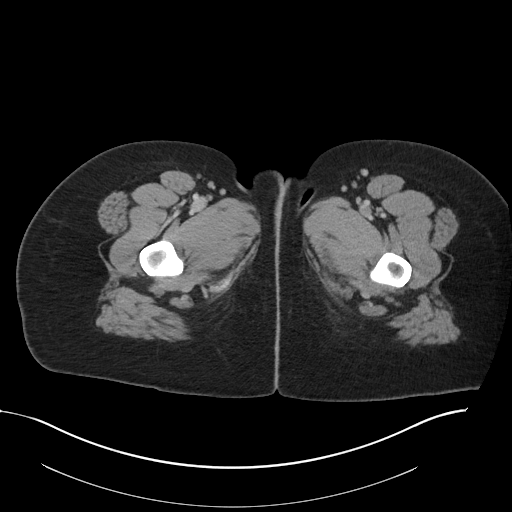
[im 4/91  bone]
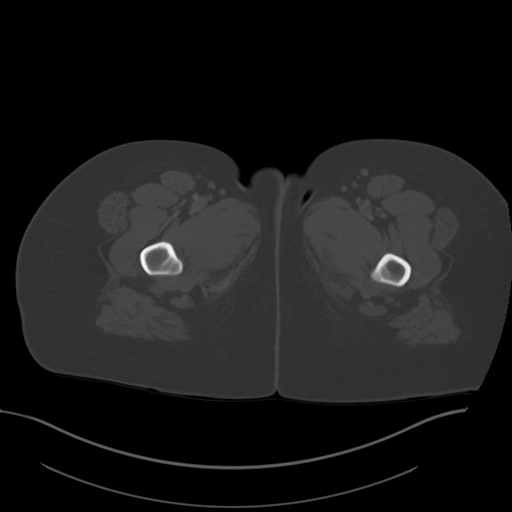
[im 11/91  soft-tissue]
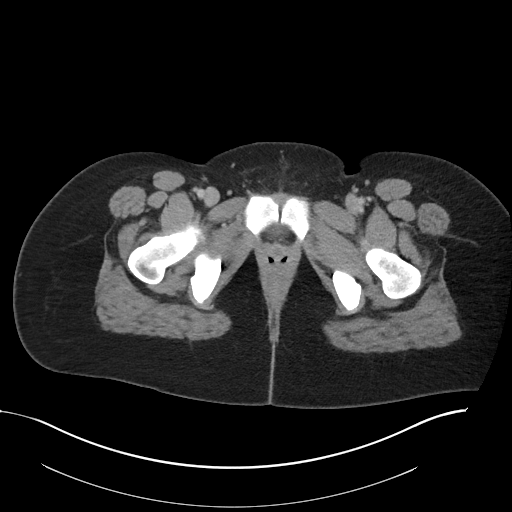
[im 19/91  soft-tissue]
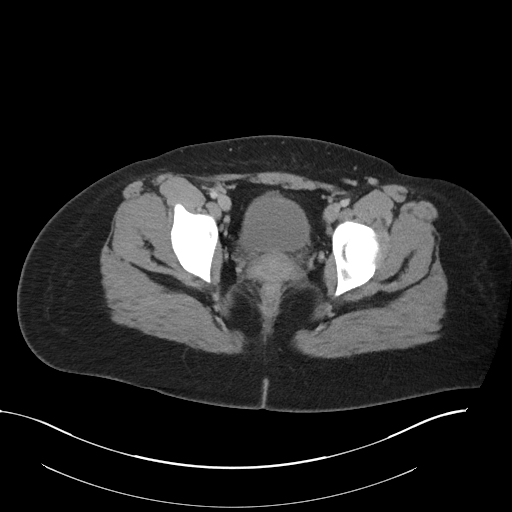
[im 26/91  soft-tissue]
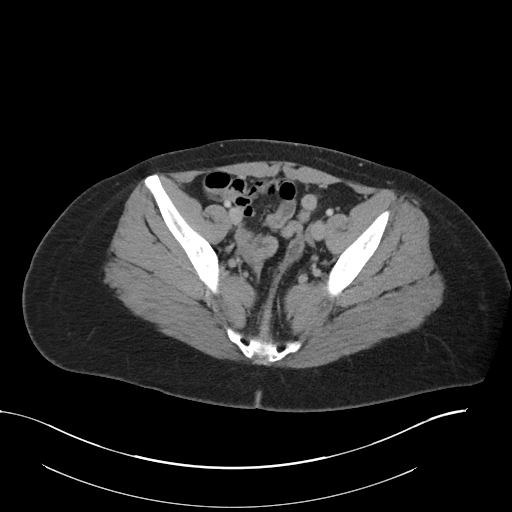
[im 29/91  soft-tissue]
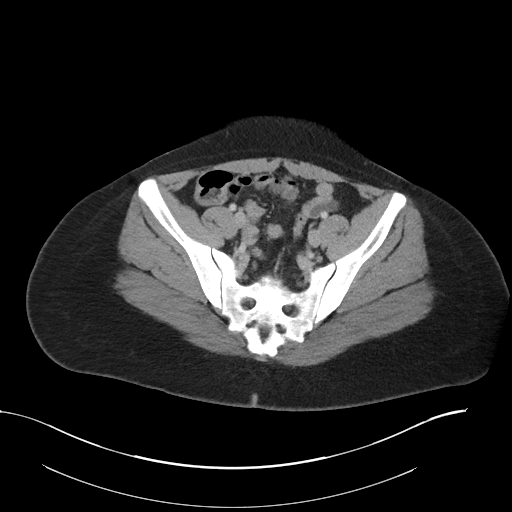
[im 37/91  soft-tissue]
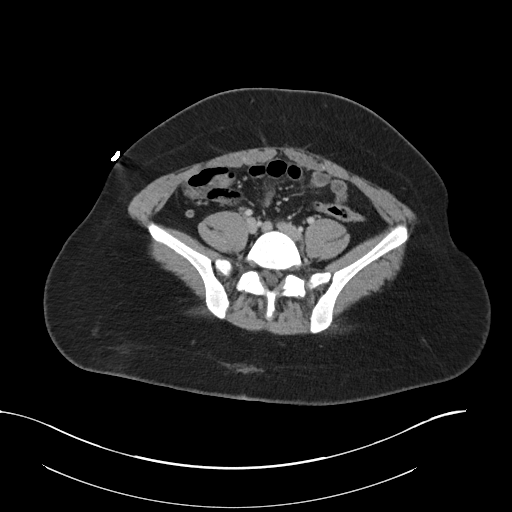
[im 44/91  soft-tissue]
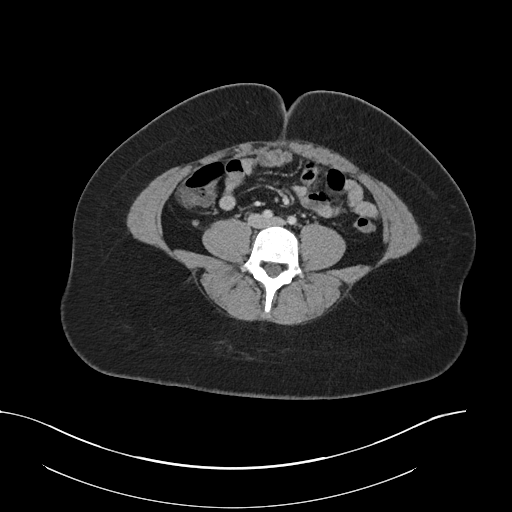
[im 47/91  soft-tissue]
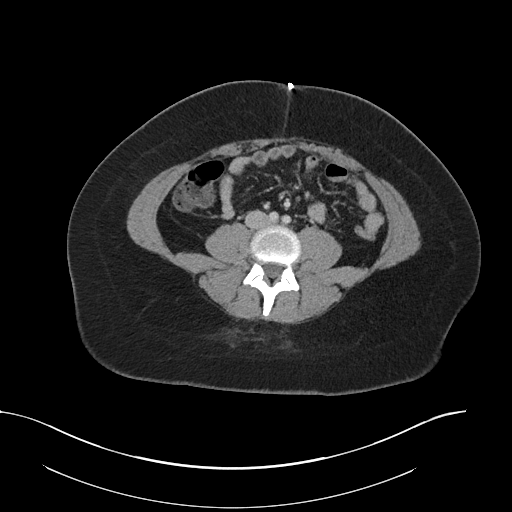
[im 55/91  soft-tissue]
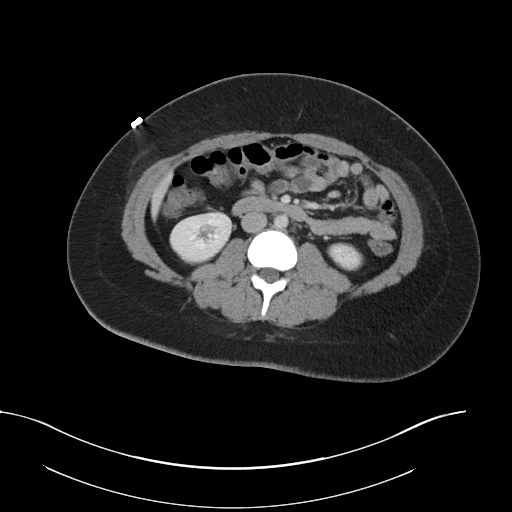
[im 55/91  bone]
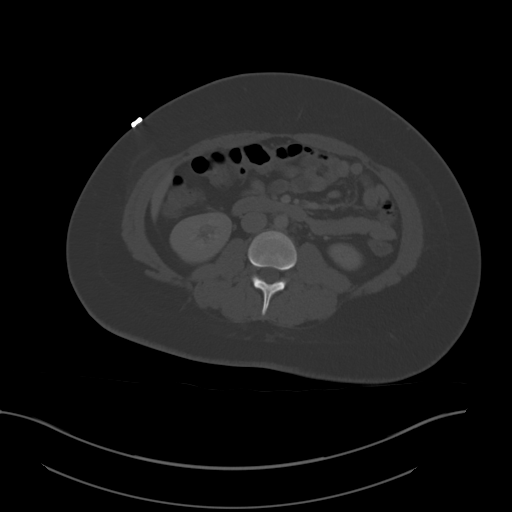
[im 62/91  soft-tissue]
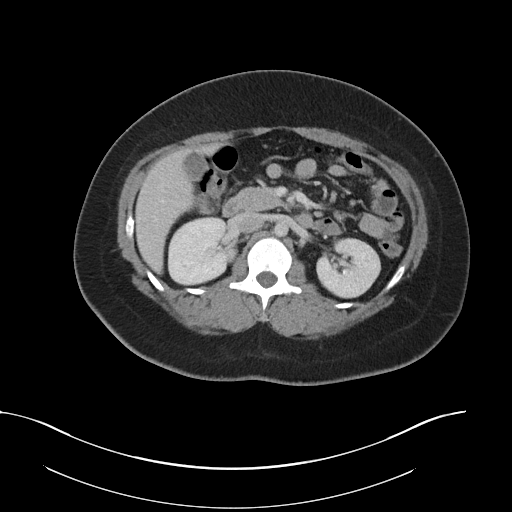
[im 69/91  soft-tissue]
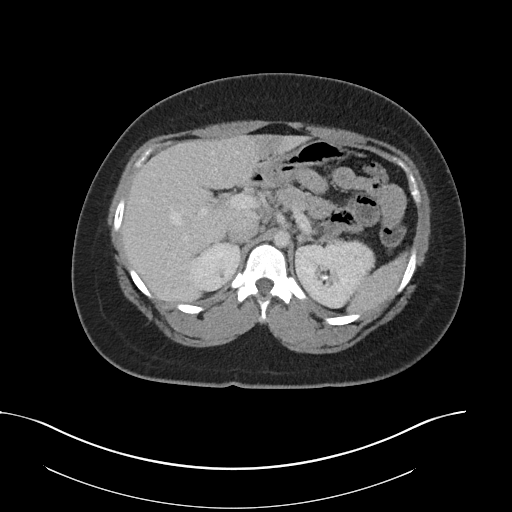
[im 73/91  soft-tissue]
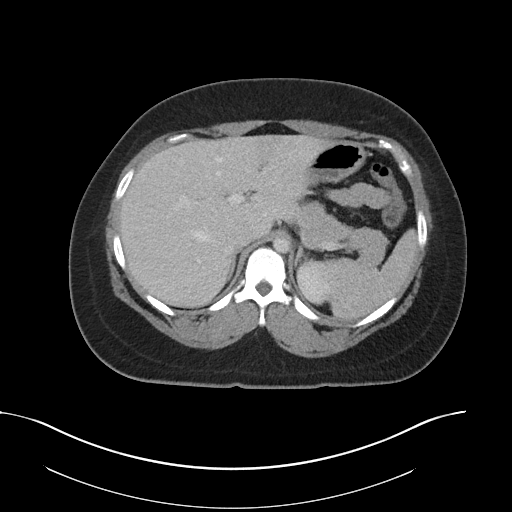
[im 80/91  soft-tissue]
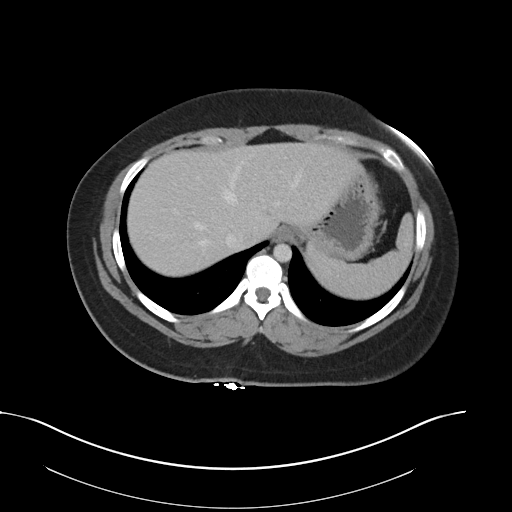
[im 87/91  soft-tissue]
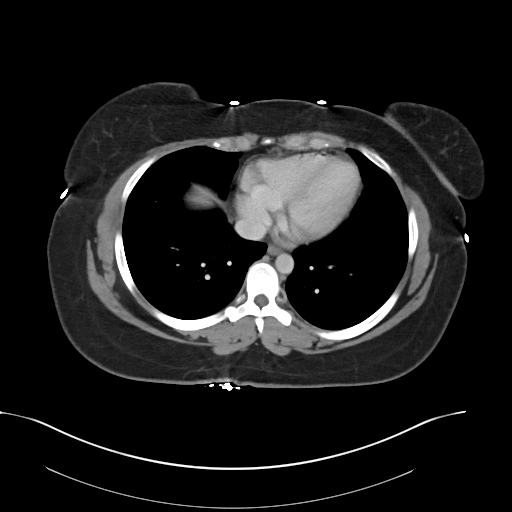

[Series 6: coronal soft tissue · coronal · 0.89mm/px · 3 of 109 slices shown]
[im 37/109  soft-tissue]
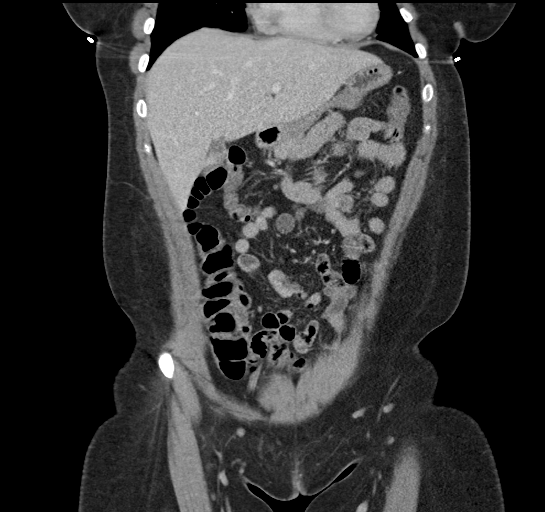
[im 49/109  soft-tissue]
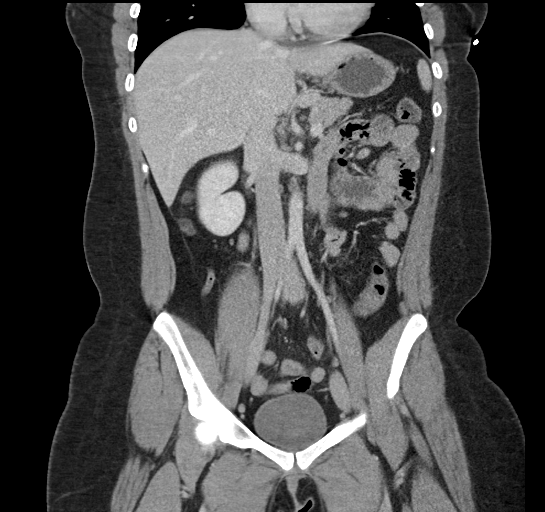
[im 61/109  soft-tissue]
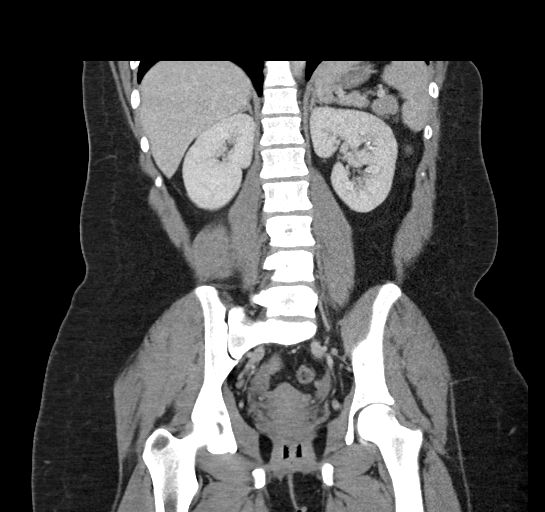

[17 of 46 positions shown; findings below may reference images not displayed]

FINDINGS: Lower chest: The visualized heart size within normal limits. No
pericardial fluid/thickening.

No hiatal hernia.

The visualized portions of the lungs are clear.

Hepatobiliary: The liver is normal in density without focal
abnormality.The main portal vein is patent. No evidence of calcified
gallstones, gallbladder wall thickening or biliary dilatation.

Pancreas: Unremarkable. No pancreatic ductal dilatation or
surrounding inflammatory changes.

Spleen: Normal in size without focal abnormality.

Adrenals/Urinary Tract: Both adrenal glands appear normal. The
kidneys and collecting system appear normal without evidence of
urinary tract calculus or hydronephrosis. Bladder is unremarkable.

Stomach/Bowel: The stomach, small bowel, and colon are normal in
appearance. No inflammatory changes, wall thickening, or obstructive
findings.The appendix is normal.

Vascular/Lymphatic: There are no enlarged mesenteric,
retroperitoneal, or pelvic lymph nodes. No significant vascular
findings are present.

Reproductive: The uterus and adnexa are unremarkable.

Other: No evidence of abdominal wall mass or hernia.

Musculoskeletal: No acute or significant osseous findings.
IMPRESSION: No acute intra-abdominopelvic pathology to explain the patient's
symptoms
